# Patient Record
Sex: Female | Born: 1950 | Race: White | Hispanic: No | Marital: Married | Smoking: Never smoker
Health system: Southern US, Community
[De-identification: ages and names within clinical notes are randomized; demographics above are authoritative.]

## PROBLEM LIST (undated history)

## (undated) HISTORY — PX: BREAST SURGERY: SHX581

## (undated) HISTORY — PX: ABDOMINAL HYSTERECTOMY: SHX81

## (undated) HISTORY — PX: CERVICAL FUSION: SHX112

## (undated) HISTORY — PX: APPENDECTOMY: SHX54

## (undated) HISTORY — PX: HERNIA REPAIR: SHX51

## (undated) HISTORY — PX: CYST REMOVAL HAND: SHX6279

## (undated) HISTORY — PX: TONSILLECTOMY AND ADENOIDECTOMY: SUR1326

---

## 2004-12-31 ENCOUNTER — Emergency Department (HOSPITAL_COMMUNITY): Admission: EM | Admit: 2004-12-31 | Discharge: 2004-12-31 | Payer: Self-pay | Admitting: Emergency Medicine

## 2006-07-21 IMAGING — CR DG SHOULDER 2+V*L*
3 series · 3 of 3 positions shown · non-contrast
Comparison: none

CLINICAL DATA: Fell.  Diffuse pain.   Left rib pain. 
LEFT SHOUDER ? 3 VIEW:
There is no evidence of fracture or dislocation.  There is no evidence of arthropathy or other focal bone abnormality.  Soft tissues are unremarkable.

[w shoulder ap internal left]
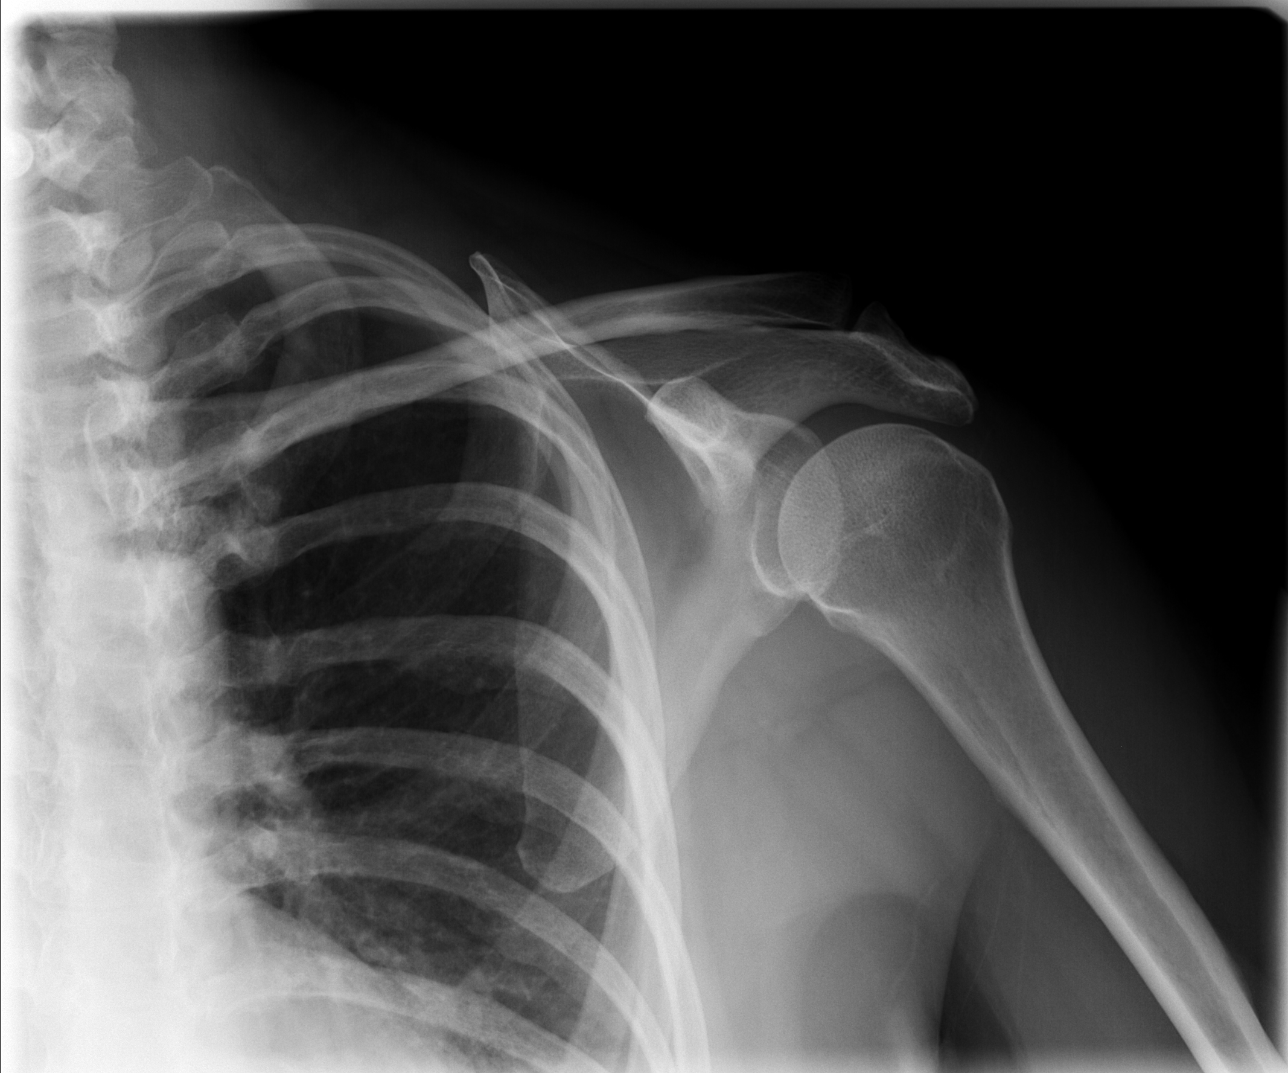

[w shoulder ap external left]
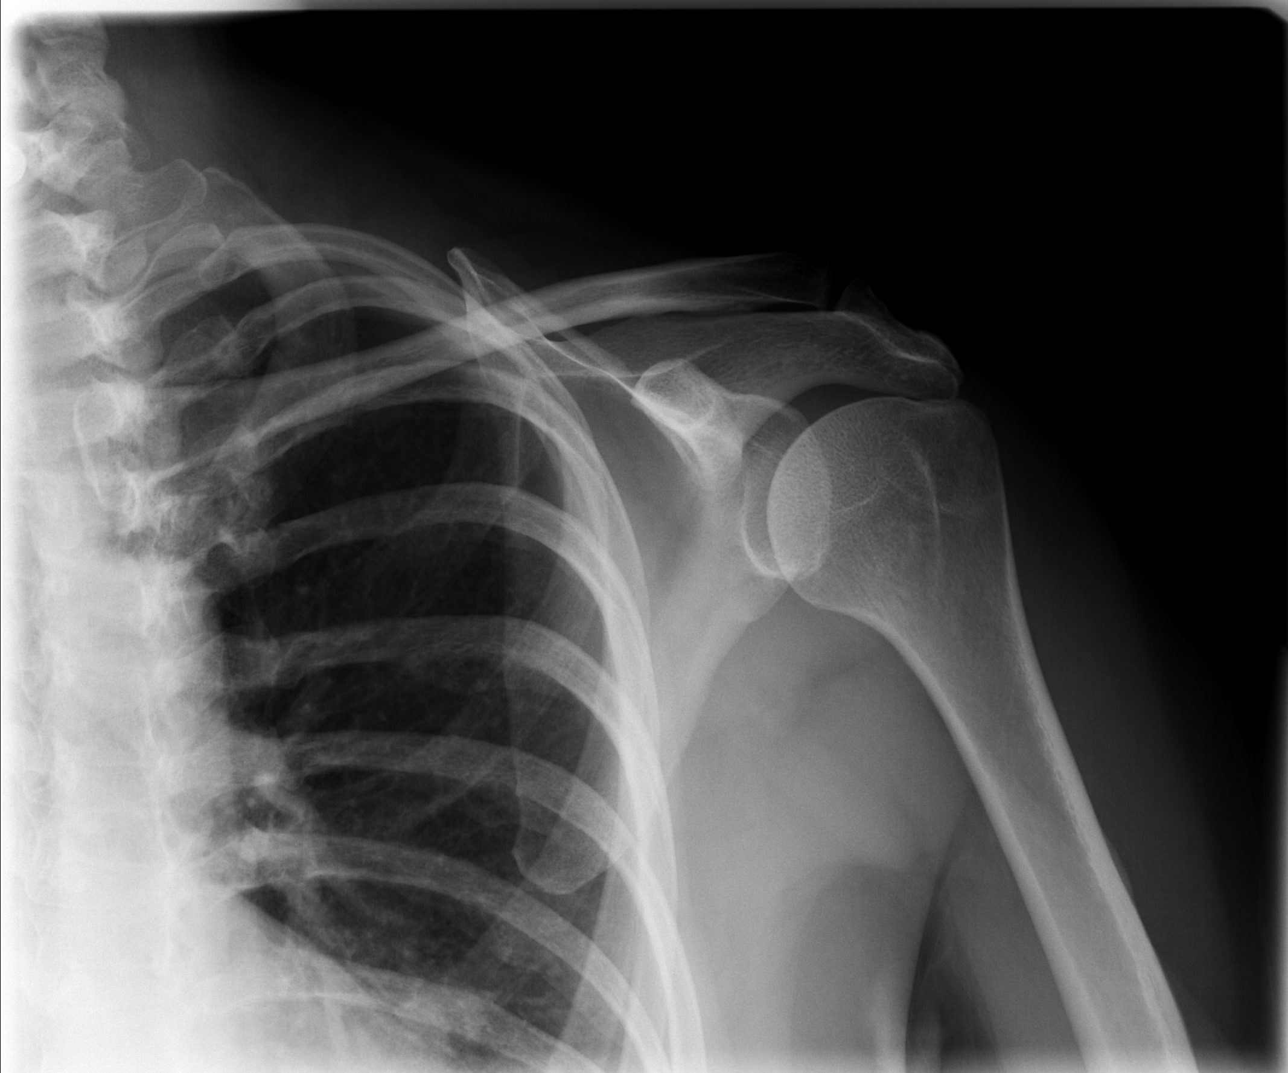

[w shoulder y view left]
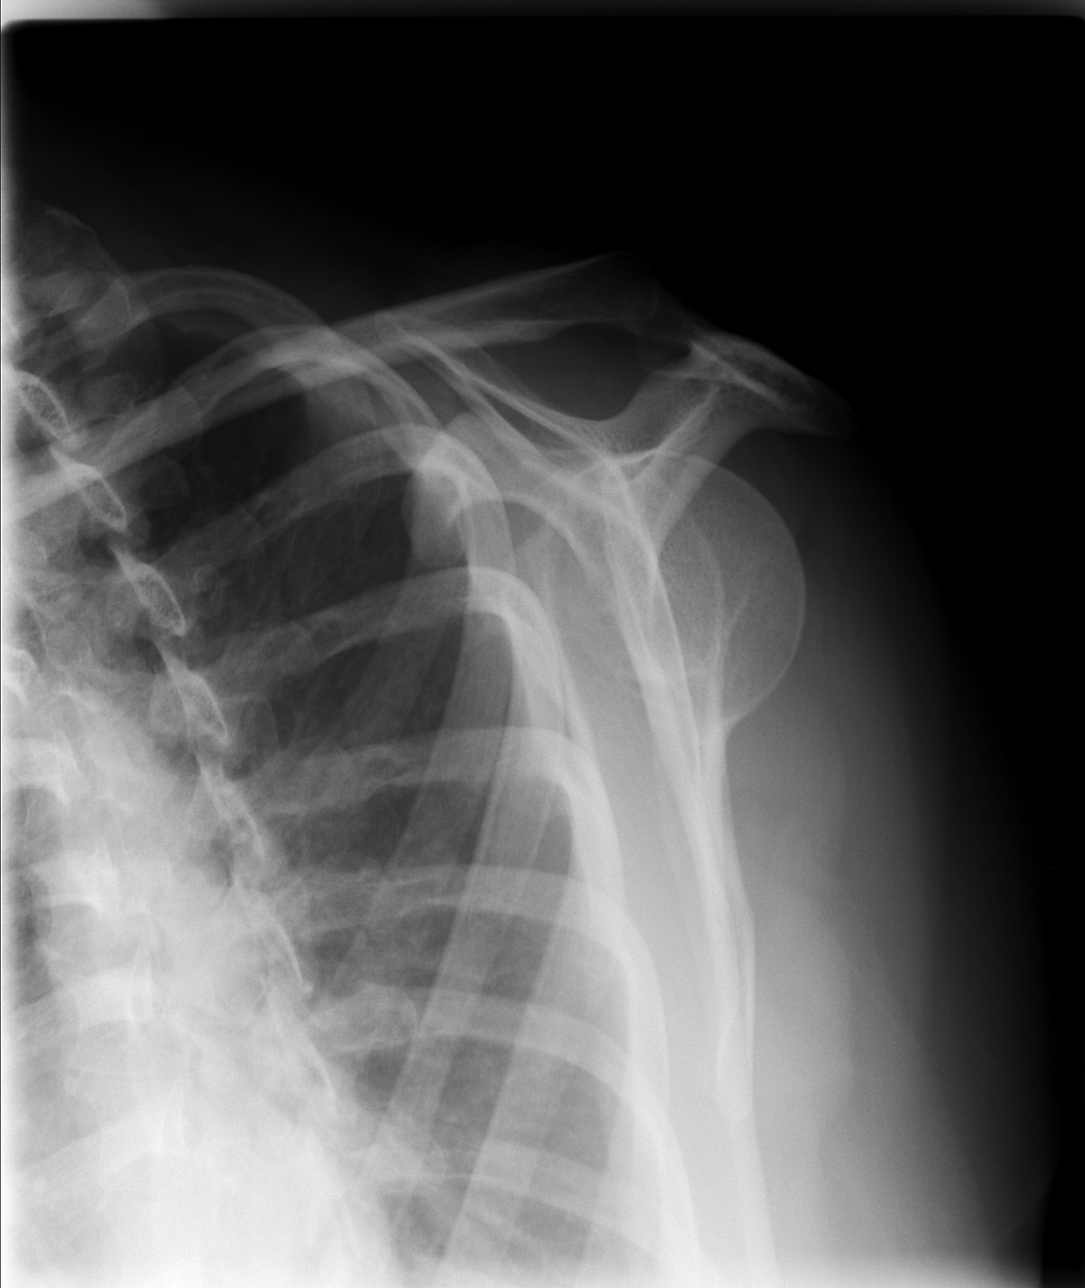

[3 of 3 positions shown; findings below may reference images not displayed]

IMPRESSION: Negative.
LEFT RIBS ? two VIEW:
No fracture or other bone lesions are seen involving the ribs.
IMPRESSION: Negative.

## 2006-07-21 IMAGING — CR DG RIBS 2V*L*
2 series · 2 of 2 positions shown · non-contrast
Comparison: none

CLINICAL DATA: Fell.  Diffuse pain.   Left rib pain. 
LEFT SHOUDER ? 3 VIEW:
There is no evidence of fracture or dislocation.  There is no evidence of arthropathy or other focal bone abnormality.  Soft tissues are unremarkable.

[w ribs ap/pa lower left]
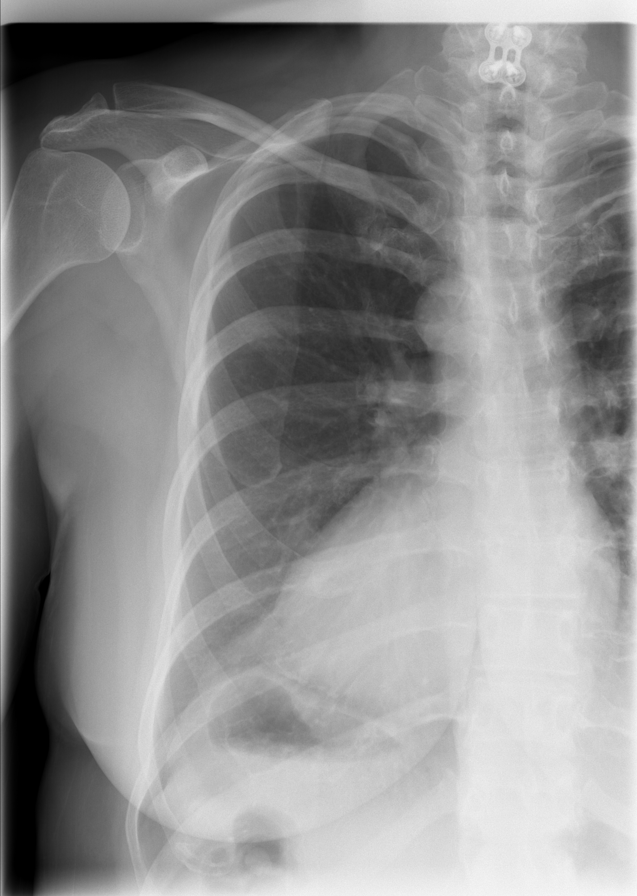

[w ribs oblique left]
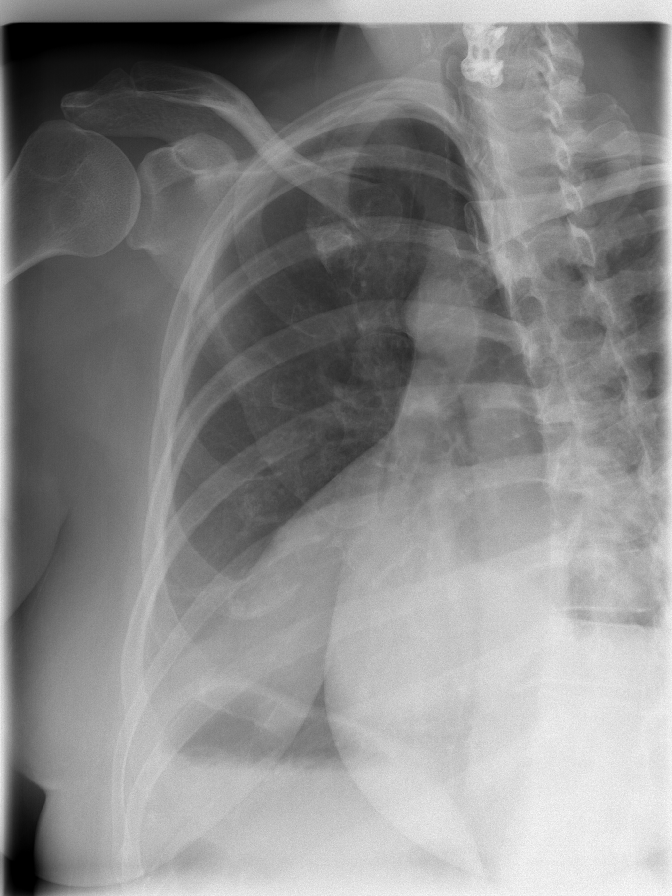

[2 of 2 positions shown; findings below may reference images not displayed]

IMPRESSION: Negative.
LEFT RIBS ? two VIEW:
No fracture or other bone lesions are seen involving the ribs.
IMPRESSION: Negative.

## 2011-10-02 DIAGNOSIS — G43909 Migraine, unspecified, not intractable, without status migrainosus: Secondary | ICD-10-CM | POA: Insufficient documentation

## 2011-10-16 DIAGNOSIS — G4733 Obstructive sleep apnea (adult) (pediatric): Secondary | ICD-10-CM | POA: Insufficient documentation

## 2011-10-16 DIAGNOSIS — K219 Gastro-esophageal reflux disease without esophagitis: Secondary | ICD-10-CM | POA: Insufficient documentation

## 2020-05-21 NOTE — Progress Notes (Signed)
[facility] [address] [place], [place] [address]  Pulmonary Sleep Medicine   Office Visit Note  Patient Name: Belinda Mccarthy DOB: January 09, 1951 MRN [id number]  I connected with  Caroline Longie on 05/23/20 by a video enabled telemedicine application and verified that I am speaking with the correct person using two identifiers.   I discussed the limitations of evaluation and management by telemedicine. The patient expressed understanding and agreed to proceed.   Chief Complaint: Obstructive Sleep Apnea visit  Brief History:  Darchelle is seen today for OSA The patient has a long history of sleep apnea. Patient is using PAP nightly.  The patient feels better after sleeping with PAP.  The patient reports benefit from PAP use. Reported sleepiness is  improved and the Epworth Sleepiness Score is 8 out of 24. The patient does take naps. The patient complains of the following: Congestion and sore throat.  The compliance download shows  compliance with an average use time of 9 hours. The AHI is 1.6  The patient does have some upper extremity limb movements disrupting sleep that is residual from a neck problem.  ROS  General: (-) fever, (-) chills, (-) night sweat Nose and Sinuses: (-) nasal stuffiness or itchiness, (-) postnasal drip, (-) nosebleeds, (+) sinus trouble. Mouth and Throat: (-) sore throat, (-) hoarseness. Neck: (-) swollen glands, (-) enlarged thyroid, (-) neck pain. Respiratory: + cough, - shortness of breath, - wheezing. Neurologic: + numbness, + tingling. Psychiatric: - anxiety, - depression   Current Medication: Outpatient Encounter Medications as of 05/22/2020  Medication Sig  . pantoprazole (PROTONIX) 40 MG tablet pantoprazole 40 mg tablet,delayed release  . rizatriptan (MAXALT) 10 MG tablet rizatriptan 10 mg tablet  . Loratadine 10 MG CAPS Take by mouth.  . polyethylene glycol powder (GLYCOLAX/MIRALAX) 17 GM/SCOOP powder Take by mouth.   No  facility-administered encounter medications on file as of 05/22/2020.    Surgical History: Past Surgical History:  Procedure Laterality Date  . ABDOMINAL HYSTERECTOMY    . APPENDECTOMY    . BREAST SURGERY    . CERVICAL FUSION    . CYST REMOVAL HAND    . HERNIA REPAIR    . TONSILLECTOMY AND ADENOIDECTOMY      Medical History: History reviewed. No pertinent past medical history.  Family History: Non contributory to the present illness  Social History: Social History   Socioeconomic History  . Marital status: Married    Spouse name: Not on file  . Number of children: Not on file  . Years of education: Not on file  . Highest education level: Not on file  Occupational History  . Not on file  Tobacco Use  . Smoking status: Never Smoker  . Smokeless tobacco: Never Used  Substance and Sexual Activity  . Alcohol use: Never  . Drug use: Never  . Sexual activity: Not on file  Other Topics Concern  . Not on file  Social History Narrative  . Not on file   Social Determinants of Health   Financial Resource Strain: Not on file  Food Insecurity: Not on file  Transportation Needs: Not on file  Physical Activity: Not on file  Stress: Not on file  Social Connections: Not on file  Intimate Partner Violence: Not on file    Vital Signs: There were no vitals taken for this visit.  Examination: General Appearance: The patient is well-developed, well-nourished, and in no distress. Neck Circumference:  Skin: Gross inspection of skin unremarkable. Head: normocephalic, no gross  deformities. Eyes: no gross deformities noted. ENT: ears appear grossly normal Neurologic: Alert and oriented. No involuntary movements.    EPWORTH SLEEPINESS SCALE:  Scale:  (0)= no chance of dozing; (1)= slight chance of dozing; (2)= moderate chance of dozing; (3)= high chance of dozing  Chance  Situtation    Sitting and reading: 1    Watching TV: 1    Sitting Inactive in public: 1    As a  passenger in car: 1      Lying down to rest: 3    Sitting and talking: 0    Sitting quielty after lunch: 1    In a car, stopped in traffic: 0   TOTAL SCORE:   8 out of 24    SLEEP STUDIES:  1. PSG 04/04/16 AHI 7 Spo61min 85%   CPAP COMPLIANCE DATA:  Date Range: 05/22/19-05/20/20  Average Daily Use: 8.7 hours  Median Use: 8.7  Compliance for > 4 Hours: 100%   AHI: 1.6 respiratory events per hour  Days Used: 365/365  Mask Leak: 12.3  95th Percentile Pressure: APAP 8-14          LABS: No results found for this or any previous visit (from the past 2160 hour(s)).  Radiology: DG Nasal Bones  Result Date: 01/01/2005 Clinical Data: Larey Seat. Abrasion to nose. Headache. Dizziness. Bilateral jaw pain.  ORTHOPANTOGRAM (PANOREX): Findings: Multiple dental amalgams are present. Upper right first molar is missing. There is no fracture or jaw dislocation.  IMPRESSION: No acute mandibular or maxillary findings.  NASAL BONES - 3 VIEW: Findings: The nasal bones on the right and left show no fracture. There may be very mild soft tissue swelling. No apparent sinus opacity is seen.  IMPRESSION: Negative for nasal bone fracture. Provider: Emelda Brothers  DG Orthopantogram  Result Date: 01/01/2005 Clinical Data: Larey Seat. Abrasion to nose. Headache. Dizziness. Bilateral jaw pain.  ORTHOPANTOGRAM (PANOREX): Findings: Multiple dental amalgams are present. Upper right first molar is missing. There is no fracture or jaw dislocation.  IMPRESSION: No acute mandibular or maxillary findings.  NASAL BONES - 3 VIEW: Findings: The nasal bones on the right and left show no fracture. There may be very mild soft tissue swelling. No apparent sinus opacity is seen.  IMPRESSION: Negative for nasal bone fracture. Provider: Emelda Brothers  DG Ribs Unilateral Left  Result Date: 01/01/2005 Clinical Data:  Larey Seat.  Diffuse pain.  Left rib pain. LEFT SHOUDER - 3 VIEW: There is no evidence of fracture or  dislocation.  There is no evidence of arthropathy or other focal bone abnormality.  Soft tissues are unremarkable. IMPRESSION: Negative. LEFT RIBS - two VIEW: No fracture or other bone lesions are seen involving the ribs. IMPRESSION: Negative. Provider: Emelda Brothers  CT Head Wo Contrast  Result Date: 01/02/2005 Clinical data: Fall. Head trauma. Headache. HEAD CT WITHOUT CONTRAST: Technique: Contiguous axial images were obtained from the base of the skull through the vertex according to standard protocol without contrast. There is no evidence of intracranial hemorrhage, brain edema, or mass effect. No other intra-axial abnormalities are seen, and the ventricles are within normal limits. No abnormal extra-axial fluid collections or masses are identified. No skull abnormalities are noted. IMPRESSION: Negative non-contrast head CT. Provider: Dicie Beam, Niel Hummer  DG Shoulder Left  Result Date: 01/01/2005 Clinical Data:  Larey Seat.  Diffuse pain.  Left rib pain. LEFT SHOUDER - 3 VIEW: There is no evidence of fracture or dislocation.  There is no evidence of arthropathy or other focal bone  abnormality.  Soft tissues are unremarkable. IMPRESSION: Negative. LEFT RIBS - two VIEW: No fracture or other bone lesions are seen involving the ribs. IMPRESSION: Negative. Provider: Emelda Brothers   No results found.  No results found.    Assessment and Plan: Patient Active Problem List   Diagnosis Date Noted  . GERD (gastroesophageal reflux disease) 10/16/2011  . OSA on CPAP 10/16/2011  . Migraines 10/02/2011      The patient can tolerate PAP and reports benefit from PAP use. The patient was reminded how to adjust mask fit.  The compliance is excellent. The AHI is 1.6.  1. OSA - Continue excellent compliance. 2. CPAP couseling-Discussed importance of adequate CPAP use as well as proper care and cleaning techniques of machine and all supplies. 3. GERD - Continue pantoprazole as needed.  General  Counseling: I have discussed the findings of the evaluation and examination with Tyrene.  I have also discussed any further diagnostic evaluation thatmay be needed or ordered today. Pauletta verbalizes understanding of the findings of todays visit. We also reviewed her medications today and discussed drug interactions and side effects including but not limited excessive drowsiness and altered mental states. We also discussed that there is always a risk not just to her but also people around her. she has been encouraged to call the office with any questions or concerns that should arise related to todays visit.  No orders of the defined types were placed in this encounter.     I have personally obtained a history, examined the patient, evaluated laboratory and imaging results, formulated the assessment and plan and placed orders.  This patient was seen by Lynn Ito, PA-C in collaboration with Dr. Freda Munro as a part of collaborative care agreement.   Valentino Hue Sol Blazing, PhD, FAASM  Diplomate, American Board of Sleep Medicine    Yevonne Pax, MD [facility] Diplomate ABMS Pulmonary and Critical Care Medicine Sleep medicine

## 2020-05-22 ENCOUNTER — Ambulatory Visit (INDEPENDENT_AMBULATORY_CARE_PROVIDER_SITE_OTHER): Payer: Medicare Other | Admitting: Internal Medicine

## 2020-05-22 DIAGNOSIS — Z7189 Other specified counseling: Secondary | ICD-10-CM | POA: Diagnosis not present

## 2020-05-22 DIAGNOSIS — Z9989 Dependence on other enabling machines and devices: Secondary | ICD-10-CM | POA: Diagnosis not present

## 2020-05-22 DIAGNOSIS — K219 Gastro-esophageal reflux disease without esophagitis: Secondary | ICD-10-CM

## 2020-05-22 DIAGNOSIS — G4733 Obstructive sleep apnea (adult) (pediatric): Secondary | ICD-10-CM | POA: Diagnosis not present

## 2020-05-22 NOTE — Patient Instructions (Signed)

## 2021-06-11 NOTE — Progress Notes (Signed)
[facility] [address] [place], [place] [address]  Pulmonary Sleep Medicine   Office Visit Note  Patient Name: Belinda Mccarthy DOB: [birth year] MRN [id number]    Chief Complaint: Obstructive Sleep Apnea visit  Brief History:  Belinda Mccarthy is seen today for annual follow up visit for APAP@ 8-14 cmH2O. The patient has a longstanding history of sleep apnea. Patient is using PAP nightly.  The patient feels rested after sleeping with PAP.  The patient reports benefiting from PAP use. Reported sleepiness is improved and the Epworth Sleepiness Score is 13 out of 24. The patient sometimes take naps. The patient complains of the following: None.  The compliance download shows 100% compliance with an average use time of 8 hours 52 minutes. The AHI is 1.9.  The patient does not complain of limb movements disrupting sleep.  ROS  General: (-) fever, (-) chills, (-) night sweat Nose and Sinuses: (-) nasal stuffiness or itchiness, (-) postnasal drip, (-) nosebleeds, (-) sinus trouble. Mouth and Throat: (-) sore throat, (-) hoarseness. Neck: (-) swollen glands, (-) enlarged thyroid, (-) neck pain. Respiratory: - cough, - shortness of breath, - wheezing. Neurologic: + numbness, + tingling. Psychiatric: - anxiety, - depression   Current Medication: Outpatient Encounter Medications as of 06/12/2021  Medication Sig   pantoprazole (PROTONIX) 40 MG tablet pantoprazole 40 mg tablet,delayed release   polyethylene glycol powder (GLYCOLAX/MIRALAX) 17 GM/SCOOP powder Take by mouth.   rizatriptan (MAXALT) 10 MG tablet rizatriptan 10 mg tablet   [DISCONTINUED] Loratadine 10 MG CAPS Take by mouth.   No facility-administered encounter medications on file as of 06/12/2021.    Surgical History: Past Surgical History:  Procedure Laterality Date   ABDOMINAL HYSTERECTOMY     APPENDECTOMY     BREAST SURGERY     CERVICAL FUSION     CYST REMOVAL HAND     HERNIA REPAIR     TONSILLECTOMY AND  ADENOIDECTOMY      Medical History: History reviewed. No pertinent past medical history.  Family History: Non contributory to the present illness  Social History: Social History   Socioeconomic History   Marital status: Married    Spouse name: Not on file   Number of children: Not on file   Years of education: Not on file   Highest education level: Not on file  Occupational History   Not on file  Tobacco Use   Smoking status: Never   Smokeless tobacco: Never  Substance and Sexual Activity   Alcohol use: Never   Drug use: Never   Sexual activity: Not on file  Other Topics Concern   Not on file  Social History Narrative   Not on file   Social Determinants of Health   Financial Resource Strain: Not on file  Food Insecurity: Not on file  Transportation Needs: Not on file  Physical Activity: Not on file  Stress: Not on file  Social Connections: Not on file  Intimate Partner Violence: Not on file    Vital Signs: Blood pressure 135/81, pulse 75, resp. rate 18, height 5\' 4"  (1.626 m), weight 245 lb (111.1 kg), SpO2 100 %. Body mass index is 42.05 kg/m.    Examination: General Appearance: The patient is well-developed, well-nourished, and in no distress. Neck Circumference: 43 cm Skin: Gross inspection of skin unremarkable. Head: normocephalic, no gross deformities. Eyes: no gross deformities noted. ENT: ears appear grossly normal Neurologic: Alert and oriented. No involuntary movements.    EPWORTH SLEEPINESS SCALE:  Scale:  (0)= no  chance of dozing; (1)= slight chance of dozing; (2)= moderate chance of dozing; (3)= high chance of dozing  Chance  Situtation    Sitting and reading: 2    Watching TV: 2    Sitting Inactive in public: 2    As a passenger in car: 2      Lying down to rest: 3    Sitting and talking: 0    Sitting quielty after lunch: 2    In a car, stopped in traffic: 0   TOTAL SCORE:   13 out of 24    SLEEP STUDIES:  PSG  (03/2016) AHI 7/hr, min SpO2 85% Titration (04/2016) APAP@ 8-14 cmH2O   CPAP COMPLIANCE DATA:  Date Range: 06/04/2020-06/03/2021  Average Daily Use: 8 hours 52 minutes  Median Use: 8 hours 49 minutes  Compliance for > 4 Hours: 100%  AHI: 1.9 respiratory events per hour  Days Used: 365/365 days  Mask Leak: 31.1  95th Percentile Pressure: 12.8         LABS: No results found for this or any previous visit (from the past 2160 hour(s)).  Radiology: DG Nasal Bones  Result Date: 01/01/2005 Clinical Data: Larey Seat. Abrasion to nose. Headache. Dizziness. Bilateral jaw pain.  ORTHOPANTOGRAM (PANOREX): Findings: Multiple dental amalgams are present. Upper right first molar is missing. There is no fracture or jaw dislocation.  IMPRESSION: No acute mandibular or maxillary findings.  NASAL BONES - 3 VIEW: Findings: The nasal bones on the right and left show no fracture. There may be very mild soft tissue swelling. No apparent sinus opacity is seen.  IMPRESSION: Negative for nasal bone fracture. Provider: Emelda Brothers  DG Orthopantogram  Result Date: 01/01/2005 Clinical Data: Larey Seat. Abrasion to nose. Headache. Dizziness. Bilateral jaw pain.  ORTHOPANTOGRAM (PANOREX): Findings: Multiple dental amalgams are present. Upper right first molar is missing. There is no fracture or jaw dislocation.  IMPRESSION: No acute mandibular or maxillary findings.  NASAL BONES - 3 VIEW: Findings: The nasal bones on the right and left show no fracture. There may be very mild soft tissue swelling. No apparent sinus opacity is seen.  IMPRESSION: Negative for nasal bone fracture. Provider: Emelda Brothers  DG Ribs Unilateral Left  Result Date: 01/01/2005 Clinical Data:  Larey Seat.  Diffuse pain.  Left rib pain. LEFT SHOUDER - 3 VIEW: There is no evidence of fracture or dislocation.  There is no evidence of arthropathy or other focal bone abnormality.  Soft tissues are unremarkable. IMPRESSION: Negative. LEFT RIBS - two  VIEW: No fracture or other bone lesions are seen involving the ribs. IMPRESSION: Negative. Provider: Emelda Brothers  CT Head Wo Contrast  Result Date: 01/02/2005 Clinical data: Fall. Head trauma. Headache. HEAD CT WITHOUT CONTRAST: Technique: Contiguous axial images were obtained from the base of the skull through the vertex according to standard protocol without contrast. There is no evidence of intracranial hemorrhage, brain edema, or mass effect. No other intra-axial abnormalities are seen, and the ventricles are within normal limits. No abnormal extra-axial fluid collections or masses are identified. No skull abnormalities are noted. IMPRESSION: Negative non-contrast head CT. Provider: Dicie Beam, Niel Hummer  DG Shoulder Left  Result Date: 01/01/2005 Clinical Data:  Larey Seat.  Diffuse pain.  Left rib pain. LEFT SHOUDER - 3 VIEW: There is no evidence of fracture or dislocation.  There is no evidence of arthropathy or other focal bone abnormality.  Soft tissues are unremarkable. IMPRESSION: Negative. LEFT RIBS - two VIEW: No fracture or other bone lesions are seen  involving the ribs. IMPRESSION: Negative. Provider: Emelda Brothers   No results found.  No results found.    Assessment and Plan: Patient Active Problem List   Diagnosis Date Noted   GERD (gastroesophageal reflux disease) 10/16/2011   OSA on CPAP 10/16/2011   Migraines 10/02/2011      The patient does tolerate PAP and reports benefit from PAP use. The patient was reminded how to adjust mask fit and advised to change supplies regularly. The patient was also counselled on nightly use. The compliance is excellent. The AHI is 1.9.   1. OSA on CPAP Continue excellent compliance  2. CPAP use counseling CPAP couseling-Discussed importance of adequate CPAP use as well as proper care and cleaning techniques of machine and all supplies.  3. Gastroesophageal reflux disease without esophagitis Continue PPI  4. Morbid obesity  with BMI of 40.0-44.9, adult (HCC) Obesity Counseling: Had a lengthy discussion regarding patients BMI and weight issues. Patient was instructed on portion control as well as increased activity. Also discussed caloric restrictions with trying to maintain intake less than 2000 Kcal. Discussions were made in accordance with the 5As of weight management. Simple actions such as not eating late and if able to, taking a walk is suggested.    General Counseling: I have discussed the findings of the evaluation and examination with Belinda Mccarthy.  I have also discussed any further diagnostic evaluation thatmay be needed or ordered today. Belinda Mccarthy verbalizes understanding of the findings of todays visit. We also reviewed her medications today and discussed drug interactions and side effects including but not limited excessive drowsiness and altered mental states. We also discussed that there is always a risk not just to her but also people around her. she has been encouraged to call the office with any questions or concerns that should arise related to todays visit.  No orders of the defined types were placed in this encounter.       I have personally obtained a history, examined the patient, evaluated laboratory and imaging results, formulated the assessment and plan and placed orders.  This patient was seen by Lynn Ito, PA-C in collaboration with Dr. Freda Munro as a part of collaborative care agreement.  Yevonne Pax, MD [facility] Diplomate ABMS Pulmonary Critical Care Medicine and Sleep Medicine

## 2021-06-12 ENCOUNTER — Ambulatory Visit (INDEPENDENT_AMBULATORY_CARE_PROVIDER_SITE_OTHER): Payer: Medicare Other | Admitting: Internal Medicine

## 2021-06-12 VITALS — BP 135/81 | HR 75 | Resp 18 | Ht 64.0 in | Wt 245.0 lb

## 2021-06-12 DIAGNOSIS — G4733 Obstructive sleep apnea (adult) (pediatric): Secondary | ICD-10-CM | POA: Diagnosis not present

## 2021-06-12 DIAGNOSIS — K219 Gastro-esophageal reflux disease without esophagitis: Secondary | ICD-10-CM

## 2021-06-12 DIAGNOSIS — Z7189 Other specified counseling: Secondary | ICD-10-CM | POA: Diagnosis not present

## 2021-06-12 DIAGNOSIS — Z9989 Dependence on other enabling machines and devices: Secondary | ICD-10-CM

## 2021-06-12 DIAGNOSIS — Z6841 Body Mass Index (BMI) 40.0 and over, adult: Secondary | ICD-10-CM

## 2021-06-25 NOTE — Patient Instructions (Signed)

## 2022-06-09 NOTE — Progress Notes (Signed)
[facility] [place], [place] [address]  Pulmonary Sleep Medicine   Office Visit Note  Patient Name: Belinda Mccarthy DOB: 18-Jan-1951 MRN [id number]    Chief Complaint: Obstructive Sleep Apnea visit  Brief History:  Belinda Mccarthy is seen today for an annual follow up visit for APAP@ 8-14 cmH2O. The patient has a longstanding history of sleep apnea. Patient is using PAP nightly.  The patient feels rested after sleeping with PAP.  The patient reports benefiting from PAP use. Reported sleepiness is  improved and the Epworth Sleepiness Score is 11 out of 24. The patient occasionally will take naps. The patient complains of the following: none.  The compliance download shows 99% compliance with an average use time of 8 hours 53 minutes. The AHI is 2.5.  The patient does not complain of limb movements disrupting sleep. The patient continues to require PAP therapy in order to eliminate sleep apnea.   ROS  General: (-) fever, (-) chills, (-) night sweat Nose and Sinuses: (-) nasal stuffiness or itchiness, (-) postnasal drip, (-) nosebleeds, (-) sinus trouble. Mouth and Throat: (-) sore throat, (-) hoarseness. Neck: (-) swollen glands, (-) enlarged thyroid, (-) neck pain. Respiratory: - cough, - shortness of breath, - wheezing. Neurologic: + numbness, + tingling. Psychiatric: - anxiety, - depression   Current Medication: Outpatient Encounter Medications as of 06/10/2022  Medication Sig   pantoprazole (PROTONIX) 40 MG tablet pantoprazole 40 mg tablet,delayed release   polyethylene glycol powder (GLYCOLAX/MIRALAX) 17 GM/SCOOP powder Take by mouth.   rizatriptan (MAXALT) 10 MG tablet rizatriptan 10 mg tablet   No facility-administered encounter medications on file as of 06/10/2022.    Surgical History: Past Surgical History:  Procedure Laterality Date   ABDOMINAL HYSTERECTOMY     APPENDECTOMY     BREAST SURGERY     CERVICAL FUSION     CYST REMOVAL HAND     HERNIA  REPAIR     TONSILLECTOMY AND ADENOIDECTOMY      Medical History: History reviewed. No pertinent past medical history.  Family History: Non contributory to the present illness  Social History: Social History   Socioeconomic History   Marital status: Married    Spouse name: Not on file   Number of children: Not on file   Years of education: Not on file   Highest education level: Not on file  Occupational History   Not on file  Tobacco Use   Smoking status: Never   Smokeless tobacco: Never  Substance and Sexual Activity   Alcohol use: Never   Drug use: Never   Sexual activity: Not on file  Other Topics Concern   Not on file  Social History Narrative   Not on file   Social Determinants of Health   Financial Resource Strain: Not on file  Food Insecurity: Not on file  Transportation Needs: Not on file  Physical Activity: Not on file  Stress: Not on file  Social Connections: Not on file  Intimate Partner Violence: Not on file    Vital Signs: Blood pressure 139/89, pulse 95, resp. rate 18, height '5\' 4"'$  (1.626 m), weight 245 lb (111.1 kg), SpO2 97 %. Body mass index is 42.05 kg/m.    Examination: General Appearance: The patient is well-developed, well-nourished, and in no distress. Neck Circumference: 43 cm Skin: Gross inspection of skin unremarkable. Head: normocephalic, no gross deformities. Eyes: no gross deformities noted. ENT: ears appear grossly normal Neurologic: Alert and oriented. No involuntary movements.  STOP BANG RISK  ASSESSMENT S (snore) Have you been told that you snore?     NO   T (tired) Are you often tired, fatigued, or sleepy during the day?   YES  O (obstruction) Do you stop breathing, choke, or gasp during sleep? NO   P (pressure) Do you have or are you being treated for high blood pressure? NO   B (BMI) Is your body index greater than 35 kg/m? YES   A (age) Are you 72 years old or older? YES   N (neck) Do you have a neck  circumference greater than 16 inches?   YES   G (gender) Are you a female? NO   TOTAL STOP/BANG "YES" ANSWERS 4       A STOP-Bang score of 2 or less is considered low risk, and a score of 5 or more is high risk for having either moderate or severe OSA. For people who score 3 or 4, doctors may need to perform further assessment to determine how likely they are to have OSA.         EPWORTH SLEEPINESS SCALE:  Scale:  (0)= no chance of dozing; (1)= slight chance of dozing; (2)= moderate chance of dozing; (3)= high chance of dozing  Chance  Situtation    Sitting and reading: 2    Watching TV: 1    Sitting Inactive in public: 2    As a passenger in car: 3      Lying down to rest: 2    Sitting and talking: 0    Sitting quielty after lunch: 1    In a car, stopped in traffic: 0   TOTAL SCORE:   11 out of 24    SLEEP STUDIES:  PSG (03/2016) AHI 7/hr, min SpO2 85% Titration (04/2016) APAP@ 8-14 cmH2O   CPAP COMPLIANCE DATA:  Date Range: 06/11/2021-06/10/2022   Average Daily Use: 8 hours 53 minutes  Median Use: 8 hours 46 minutes  Compliance for > 4 Hours: 99%  AHI: 2.5 respiratory events per hour  Days Used: 364/365 days  Mask Leak: 27.6  95th Percentile Pressure: 13         LABS: No results found for this or any previous visit (from the past 2160 hour(s)).  Radiology: CT Head Wo Contrast  Result Date: 01/02/2005 Clinical data: Fall. Head trauma. Headache. HEAD CT WITHOUT CONTRAST: Technique: Contiguous axial images were obtained from the base of the skull through the vertex according to standard protocol without contrast. There is no evidence of intracranial hemorrhage, brain edema, or mass effect. No other intra-axial abnormalities are seen, and the ventricles are within normal limits. No abnormal extra-axial fluid collections or masses are identified. No skull abnormalities are noted. IMPRESSION: Negative non-contrast head CT. Provider: Alfonse Flavors,  Lennice Sites  DG Shoulder Left  Result Date: 01/01/2005 Clinical Data:  [place].  Diffuse pain.  Left rib pain. LEFT SHOUDER - 3 VIEW: There is no evidence of fracture or dislocation.  There is no evidence of arthropathy or other focal bone abnormality.  Soft tissues are unremarkable. IMPRESSION: Negative. LEFT RIBS - two VIEW: No fracture or other bone lesions are seen involving the ribs. IMPRESSION: Negative. Provider: Jaci Lazier  DG Ribs Unilateral Left  Result Date: 01/01/2005 Clinical Data:  [place].  Diffuse pain.  Left rib pain. LEFT SHOUDER - 3 VIEW: There is no evidence of fracture or dislocation.  There is no evidence of arthropathy or other focal bone abnormality.  Soft tissues are unremarkable. IMPRESSION: Negative.  LEFT RIBS - two VIEW: No fracture or other bone lesions are seen involving the ribs. IMPRESSION: Negative. Provider: Jaci Lazier  DG Nasal Bones  Result Date: 01/01/2005 Clinical Data: [place]. Abrasion to nose. Headache. Dizziness. Bilateral jaw pain.  ORTHOPANTOGRAM (PANOREX): Findings: Multiple dental amalgams are present. Upper right first molar is missing. There is no fracture or jaw dislocation.  IMPRESSION: No acute mandibular or maxillary findings.  NASAL BONES - 3 VIEW: Findings: The nasal bones on the right and left show no fracture. There may be very mild soft tissue swelling. No apparent sinus opacity is seen.  IMPRESSION: Negative for nasal bone fracture. Provider: Jaci Lazier  DG Orthopantogram  Result Date: 01/01/2005 Clinical Data: [place]. Abrasion to nose. Headache. Dizziness. Bilateral jaw pain.  ORTHOPANTOGRAM (PANOREX): Findings: Multiple dental amalgams are present. Upper right first molar is missing. There is no fracture or jaw dislocation.  IMPRESSION: No acute mandibular or maxillary findings.  NASAL BONES - 3 VIEW: Findings: The nasal bones on the right and left show no fracture. There may be very mild soft tissue swelling. No apparent sinus opacity is  seen.  IMPRESSION: Negative for nasal bone fracture. Provider: Jaci Lazier   No results found.  No results found.    Assessment and Plan: Patient Active Problem List   Diagnosis Date Noted   GERD (gastroesophageal reflux disease) 10/16/2011   OSA on CPAP 10/16/2011   Migraines 10/02/2011      The patient does tolerate PAP and reports benefit from PAP use. The patient was reminded how to adjust mask fit and advised to change supplies regularly. The patient was also counselled on nightly use. The compliance is excellent. The AHI is 2.4. Pt continues to require cpap to treat her apnea and is medically necessary.   1. OSA on CPAP Continue excellent compliance  2. CPAP use counseling CPAP couseling-Discussed importance of adequate CPAP use as well as proper care and cleaning techniques of machine and all supplies.  3. Gastroesophageal reflux disease without esophagitis Continue PPI  4. Morbid obesity with BMI of 40.0-44.9, adult ([place]) Obesity Counseling: Had a lengthy discussion regarding patients BMI and weight issues. Patient was instructed on portion control as well as increased activity. Also discussed caloric restrictions with trying to maintain intake less than 2000 Kcal. Discussions were made in accordance with the 5As of weight management. Simple actions such as not eating late and if able to, taking a walk is suggested.    General Counseling: I have discussed the findings of the evaluation and examination with Belinda Mccarthy.  I have also discussed any further diagnostic evaluation thatmay be needed or ordered today. Belinda Mccarthy verbalizes understanding of the findings of todays visit. We also reviewed her medications today and discussed drug interactions and side effects including but not limited excessive drowsiness and altered mental states. We also discussed that there is always a risk not just to her but also people around her. she has been encouraged to call the office with any  questions or concerns that should arise related to todays visit.  No orders of the defined types were placed in this encounter.       I have personally obtained a history, examined the patient, evaluated laboratory and imaging results, formulated the assessment and plan and placed orders.  This patient was seen by Drema Dallas, PA-C in collaboration with Dr. Devona Konig as a part of collaborative care agreement.  Allyne Gee, MD [facility] Diplomate ABMS Pulmonary Critical Care Medicine and Sleep Medicine

## 2022-06-10 ENCOUNTER — Ambulatory Visit (INDEPENDENT_AMBULATORY_CARE_PROVIDER_SITE_OTHER): Payer: Medicare Other | Admitting: Internal Medicine

## 2022-06-10 VITALS — BP 139/89 | HR 95 | Resp 18 | Ht 64.0 in | Wt 245.0 lb

## 2022-06-10 DIAGNOSIS — K219 Gastro-esophageal reflux disease without esophagitis: Secondary | ICD-10-CM | POA: Diagnosis not present

## 2022-06-10 DIAGNOSIS — Z6841 Body Mass Index (BMI) 40.0 and over, adult: Secondary | ICD-10-CM

## 2022-06-10 DIAGNOSIS — G4733 Obstructive sleep apnea (adult) (pediatric): Secondary | ICD-10-CM

## 2022-06-10 DIAGNOSIS — Z7189 Other specified counseling: Secondary | ICD-10-CM | POA: Diagnosis not present

## 2022-06-10 NOTE — Patient Instructions (Signed)

## 2023-03-17 ENCOUNTER — Ambulatory Visit (INDEPENDENT_AMBULATORY_CARE_PROVIDER_SITE_OTHER): Payer: Medicare Other | Admitting: Internal Medicine

## 2023-03-17 VITALS — BP 142/85 | HR 76 | Resp 18 | Ht 64.0 in | Wt 233.0 lb

## 2023-03-17 DIAGNOSIS — Z7189 Other specified counseling: Secondary | ICD-10-CM

## 2023-03-17 DIAGNOSIS — Z6841 Body Mass Index (BMI) 40.0 and over, adult: Secondary | ICD-10-CM

## 2023-03-17 DIAGNOSIS — K219 Gastro-esophageal reflux disease without esophagitis: Secondary | ICD-10-CM

## 2023-03-17 DIAGNOSIS — G4733 Obstructive sleep apnea (adult) (pediatric): Secondary | ICD-10-CM

## 2023-03-17 NOTE — Patient Instructions (Signed)

## 2023-03-17 NOTE — Progress Notes (Signed)
[facility] [address] [place], [place] [address]  Pulmonary Sleep Medicine   Office Visit Note  Patient Name: Belinda Mccarthy DOB: [birth year] MRN [id number]    Chief Complaint: Obstructive Sleep Apnea visit  Brief History:  Belinda Mccarthy is seen today for a follow up visit for APAP@ 8-14 cmH2O. The patient has a longstanding history of sleep apnea. Patient is using PAP nightly.  The patient feels rested after sleeping with PAP.  The patient reports benefiting from PAP use. Reported sleepiness is  improved and the Epworth Sleepiness Score is 10 out of 24. The patient will occasionally take naps. Prior to using a PAP, patient reports the following symptoms: snoring, fatigue, trouble concentrating, brain fogginess, choking, gasping and apneic pauses. The patient complains of the following: Patient is in need of a replacement unit. The compliance download shows 99% compliance with an average use time of 8 hours 56 minutes. The AHI is 2.7.  The patient does not complain of limb movements disrupting sleep. The patient continues to require PAP therapy in order to eliminate sleep apnea.   ROS  General: (-) fever, (-) chills, (-) night sweat Nose and Sinuses: (-) nasal stuffiness or itchiness, (-) postnasal drip, (-) nosebleeds, (-) sinus trouble. Mouth and Throat: (-) sore throat, (-) hoarseness. Neck: (-) swollen glands, (-) enlarged thyroid, (-) neck pain. Respiratory: - cough, - shortness of breath, - wheezing. Neurologic: + numbness, + tingling. Psychiatric: - anxiety, - depression   Current Medication: Outpatient Encounter Medications as of 03/17/2023  Medication Sig   pantoprazole (PROTONIX) 40 MG tablet pantoprazole 40 mg tablet,delayed release   polyethylene glycol powder (GLYCOLAX/MIRALAX) 17 GM/SCOOP powder Take by mouth.   rizatriptan (MAXALT) 10 MG tablet rizatriptan 10 mg tablet   No facility-administered encounter medications on file as of 03/17/2023.    Surgical  History: Past Surgical History:  Procedure Laterality Date   ABDOMINAL HYSTERECTOMY     APPENDECTOMY     BREAST SURGERY     CERVICAL FUSION     CYST REMOVAL HAND     HERNIA REPAIR     TONSILLECTOMY AND ADENOIDECTOMY      Medical History: History reviewed. No pertinent past medical history.  Family History: Non contributory to the present illness  Social History: Social History   Socioeconomic History   Marital status: Married    Spouse name: Not on file   Number of children: Not on file   Years of education: Not on file   Highest education level: Not on file  Occupational History   Not on file  Tobacco Use   Smoking status: Never   Smokeless tobacco: Never  Substance and Sexual Activity   Alcohol use: Never   Drug use: Never   Sexual activity: Not on file  Other Topics Concern   Not on file  Social History Narrative   Not on file   Social Determinants of Health   Financial Resource Strain: Low Risk  (08/12/2022)   Received from [facility]   Overall Financial Resource Strain (CARDIA)    Difficulty of Paying Living Expenses: Not hard at all  Food Insecurity: No Food Insecurity (08/12/2022)   Received from [facility]   Hunger Vital Sign    Worried About Running Out of Food in the Last Year: Never true    Ran Out of Food in the Last Year: Never true  Transportation Needs: No Transportation Needs (08/12/2022)   Received from [facility]  PRAPARE - Transportation    In the past 12 months, has lack of transportation kept you from medical appointments or from getting medications?: No    Lack of Transportation (Non-Medical): No  Physical Activity: Not on file  Stress: Not on file  Social Connections: Not on file  Intimate Partner Violence: Not on file    Vital Signs: Blood pressure (!) 142/85, pulse 76, resp. rate 18, height 5\' 4"  (1.626 m), weight 233 lb (105.7 kg), SpO2 97%. Body mass index is 39.99 kg/m.     Examination: General Appearance: The patient is well-developed, well-nourished, and in no distress. Neck Circumference: 43 cm Skin: Gross inspection of skin unremarkable. Head: normocephalic, no gross deformities. Eyes: no gross deformities noted. ENT: ears appear grossly normal Neurologic: Alert and oriented. No involuntary movements.  STOP BANG RISK ASSESSMENT S (snore) Have you been told that you snore?     NO   T (tired) Are you often tired, fatigued, or sleepy during the day?   NO  O (obstruction) Do you stop breathing, choke, or gasp during sleep? NO   P (pressure) Do you have or are you being treated for high blood pressure? NO   B (BMI) Is your body index greater than 35 kg/m? YES   A (age) Are you 82 years old or older? YES   N (neck) Do you have a neck circumference greater than 16 inches?   YES   G (gender) Are you a female? NO   TOTAL STOP/BANG "YES" ANSWERS 3       A STOP-Bang score of 2 or less is considered low risk, and a score of 5 or more is high risk for having either moderate or severe OSA. For people who score 3 or 4, doctors may need to perform further assessment to determine how likely they are to have OSA.         EPWORTH SLEEPINESS SCALE:  Scale:  (0)= no chance of dozing; (1)= slight chance of dozing; (2)= moderate chance of dozing; (3)= high chance of dozing  Chance  Situtation    Sitting and reading: 2    Watching TV: 2    Sitting Inactive in public: 1    As a passenger in car: 1      Lying down to rest: 3    Sitting and talking: 0    Sitting quielty after lunch: 1    In a car, stopped in traffic: 0   TOTAL SCORE:   10 out of 24    SLEEP STUDIES:  PSG (03/2016) AHI 7/hr, min SpO2 85% Titration (04/2016) APAP@ 8-14 cmH2O   CPAP COMPLIANCE DATA:  Date Range: 03/18/2022-03/17/2023  Average Daily Use: 8 hours 56 minutes  Median Use: 8 hours 45 minutes  Compliance for > 4 Hours: 99%  AHI: 2.7 respiratory events per  hour  Days Used: 363/365 days  Mask Leak: 28.9  95th Percentile Pressure: 13.3         LABS: No results found for this or any previous visit (from the past 2160 hour(s)).  Radiology: CT Head Wo Contrast  Result Date: 01/02/2005 Clinical data: Fall. Head trauma. Headache. HEAD CT WITHOUT CONTRAST: Technique: Contiguous axial images were obtained from the base of the skull through the vertex according to standard protocol without contrast. There is no evidence of intracranial hemorrhage, brain edema, or mass effect. No other intra-axial abnormalities are seen, and the ventricles are within normal limits. No abnormal extra-axial fluid collections or masses are identified.  No skull abnormalities are noted. IMPRESSION: Negative non-contrast head CT. Provider: Dicie Beam, Niel Hummer  DG Shoulder Left  Result Date: 01/01/2005 Clinical Data:  [place].  Diffuse pain.  Left rib pain. LEFT SHOUDER - 3 VIEW: There is no evidence of fracture or dislocation.  There is no evidence of arthropathy or other focal bone abnormality.  Soft tissues are unremarkable. IMPRESSION: Negative. LEFT RIBS - two VIEW: No fracture or other bone lesions are seen involving the ribs. IMPRESSION: Negative. Provider: Emelda Brothers  DG Ribs Unilateral Left  Result Date: 01/01/2005 Clinical Data:  [place].  Diffuse pain.  Left rib pain. LEFT SHOUDER - 3 VIEW: There is no evidence of fracture or dislocation.  There is no evidence of arthropathy or other focal bone abnormality.  Soft tissues are unremarkable. IMPRESSION: Negative. LEFT RIBS - two VIEW: No fracture or other bone lesions are seen involving the ribs. IMPRESSION: Negative. Provider: Emelda Brothers  DG Nasal Bones  Result Date: 01/01/2005 Clinical Data: [place]. Abrasion to nose. Headache. Dizziness. Bilateral jaw pain.  ORTHOPANTOGRAM (PANOREX): Findings: Multiple dental amalgams are present. Upper right first molar is missing. There is no fracture or jaw  dislocation.  IMPRESSION: No acute mandibular or maxillary findings.  NASAL BONES - 3 VIEW: Findings: The nasal bones on the right and left show no fracture. There may be very mild soft tissue swelling. No apparent sinus opacity is seen.  IMPRESSION: Negative for nasal bone fracture. Provider: Emelda Brothers  DG Orthopantogram  Result Date: 01/01/2005 Clinical Data: [place]. Abrasion to nose. Headache. Dizziness. Bilateral jaw pain.  ORTHOPANTOGRAM (PANOREX): Findings: Multiple dental amalgams are present. Upper right first molar is missing. There is no fracture or jaw dislocation.  IMPRESSION: No acute mandibular or maxillary findings.  NASAL BONES - 3 VIEW: Findings: The nasal bones on the right and left show no fracture. There may be very mild soft tissue swelling. No apparent sinus opacity is seen.  IMPRESSION: Negative for nasal bone fracture. Provider: Emelda Brothers   No results found.  No results found.    Assessment and Plan: Patient Active Problem List   Diagnosis Date Noted   GERD (gastroesophageal reflux disease) 10/16/2011   OSA on CPAP 10/16/2011   Migraines 10/02/2011      The patient does tolerate PAP and reports benefit from PAP use. The patient was reminded how to adjust mask fit and advised to change supplies regularly. The patient was also counselled on nightly use. The compliance is excellent. The AHI is 2.7. Patient continues to require PAP to treat their apnea and is medically necessary. The patient's machine is past end of life and must be replaced.   1. OSA on CPAP continue excellent compliance. Follow up 30+ days after set up  2. CPAP use counseling CPAP couseling-Discussed importance of adequate CPAP use as well as proper care and cleaning techniques of machine and all supplies.  3. Gastroesophageal reflux disease without esophagitis Continue current medication and f/u with PCP.  4. Morbid obesity with BMI of 40.0-44.9, adult ([facility]) Obesity Counseling: Had a  lengthy discussion regarding patients BMI and weight issues. Patient was instructed on portion control as well as increased activity. Also discussed caloric restrictions with trying to maintain intake less than 2000 Kcal. Discussions were made in accordance with the 5As of weight management. Simple actions such as not eating late and if able to, taking a walk is suggested.    General Counseling: I have discussed the findings of the evaluation and examination with  Belinda Mccarthy.  I have also discussed any further diagnostic evaluation thatmay be needed or ordered today. Belinda Mccarthy verbalizes understanding of the findings of todays visit. We also reviewed her medications today and discussed drug interactions and side effects including but not limited excessive drowsiness and altered mental states. We also discussed that there is always a risk not just to her but also people around her. she has been encouraged to call the office with any questions or concerns that should arise related to todays visit.  No orders of the defined types were placed in this encounter.       I have personally obtained a history, examined the patient, evaluated laboratory and imaging results, formulated the assessment and plan and placed orders.  This patient was seen by Lynn Ito, PA-C in collaboration with Dr. Freda Munro as a part of collaborative care agreement.  Belinda Pax, MD [facility] Diplomate ABMS Pulmonary Critical Care Medicine and Sleep Medicine

## 2023-09-11 NOTE — Progress Notes (Unsigned)
 [facility] [address] [place], [place] [address]  Pulmonary Sleep Medicine   Office Visit Note  Patient Name: Belinda Mccarthy DOB: October 25, 1950 MRN [id number]    Chief Complaint: Obstructive Sleep Apnea visit  Brief History:  Belinda Mccarthy is seen today for a follow up visit for APAP@ 8-14 cmH2O. The patient has a longstanding history of sleep apnea. Patient is using PAP nightly.  The patient feels rested after sleeping with PAP.  The patient reports benefiting from PAP use. Reported sleepiness is  improved and the Epworth Sleepiness Score is 9 out of 24. The patient will occasionally take naps. The patient complains of the following: none.  The compliance download shows 99% compliance with an average use time of 8 hours 49 minutes. The AHI is 2.7.  The patient does not complain of limb movements disrupting sleep. The patient continues to require PAP therapy in order to eliminate sleep apnea.   ROS  General: (-) fever, (-) chills, (-) night sweat Nose and Sinuses: (-) nasal stuffiness or itchiness, (-) postnasal drip, (-) nosebleeds, (-) sinus trouble. Mouth and Throat: (-) sore throat, (-) hoarseness. Neck: (-) swollen glands, (-) enlarged thyroid, (-) neck pain. Respiratory: - cough, - shortness of breath, - wheezing. Neurologic: + numbness, + tingling. Psychiatric: - anxiety, - depression   Current Medication: Outpatient Encounter Medications as of 09/15/2023  Medication Sig   pantoprazole (PROTONIX) 40 MG tablet pantoprazole 40 mg tablet,delayed release   polyethylene glycol powder (GLYCOLAX/MIRALAX) 17 GM/SCOOP powder Take by mouth.   rizatriptan (MAXALT) 10 MG tablet rizatriptan 10 mg tablet   No facility-administered encounter medications on file as of 09/15/2023.    Surgical History: Past Surgical History:  Procedure Laterality Date   ABDOMINAL HYSTERECTOMY     APPENDECTOMY     BREAST SURGERY     CERVICAL FUSION     CYST REMOVAL HAND     HERNIA REPAIR      TONSILLECTOMY AND ADENOIDECTOMY      Medical History: History reviewed. No pertinent past medical history.  Family History: Non contributory to the present illness  Social History: Social History   Socioeconomic History   Marital status: Married    Spouse name: Not on file   Number of children: Not on file   Years of education: Not on file   Highest education level: Not on file  Occupational History   Not on file  Tobacco Use   Smoking status: Never   Smokeless tobacco: Never  Substance and Sexual Activity   Alcohol use: Never   Drug use: Never   Sexual activity: Not on file  Other Topics Concern   Not on file  Social History Narrative   Not on file   Social Drivers of Health   Financial Resource Strain: Patient Declined (08/12/2023)   Received from [facility]   Overall Financial Resource Strain (CARDIA)    Difficulty of Paying Living Expenses: Patient declined  Food Insecurity: Patient Declined (08/12/2023)   Received from [facility]   Hunger Vital Sign    Worried About Running Out of Food in the Last Year: Patient declined    Ran Out of Food in the Last Year: Patient declined  Transportation Needs: Patient Declined (08/12/2023)   Received from [facility] - Transportation    In the past 12 months, has lack of transportation kept you from medical appointments or from getting medications?: Patient declined    Lack of Transportation (  Non-Medical): Patient declined  Physical Activity: Not on file  Stress: Not on file  Social Connections: Not on file  Intimate Partner Violence: Not on file    Vital Signs: Blood pressure 132/85, pulse 75, height 5\' 3"  (1.6 m), weight 245 lb (111.1 kg). Body mass index is 43.4 kg/m.    Examination: General Appearance: The patient is well-developed, well-nourished, and in no distress. Neck Circumference: 43 cm Skin: Gross inspection of skin unremarkable. Head:  normocephalic, no gross deformities. Eyes: no gross deformities noted. ENT: ears appear grossly normal Neurologic: Alert and oriented. No involuntary movements.  STOP BANG RISK ASSESSMENT S (snore) Have you been told that you snore?     NO   T (tired) Are you often tired, fatigued, or sleepy during the day?   NO  O (obstruction) Do you stop breathing, choke, or gasp during sleep? NO   P (pressure) Do you have or are you being treated for high blood pressure? NO   B (BMI) Is your body index greater than 35 kg/m? YES   A (age) Are you 73 years old or older? YES   N (neck) Do you have a neck circumference greater than 16 inches?   YES   G (gender) Are you a female? NO   TOTAL STOP/BANG "YES" ANSWERS 3       A STOP-Bang score of 2 or less is considered low risk, and a score of 5 or more is high risk for having either moderate or severe OSA. For people who score 3 or 4, doctors may need to perform further assessment to determine how likely they are to have OSA.         EPWORTH SLEEPINESS SCALE:  Scale:  (0)= no chance of dozing; (1)= slight chance of dozing; (2)= moderate chance of dozing; (3)= high chance of dozing  Chance  Situtation    Sitting and reading: 1    Watching TV: 1    Sitting Inactive in public: 1    As a passenger in car: 2      Lying down to rest: 3    Sitting and talking: 0    Sitting quielty after lunch: 1    In a car, stopped in traffic: 0   TOTAL SCORE:   9 out of 24    SLEEP STUDIES:  PSG (03/2016) AHI 7/hr, min SpO2 85% Titration (04/2016) APAP@ 8-14 cmH2O   CPAP COMPLIANCE DATA:  Date Range: 09/16/2022-09/15/2023  Average Daily Use: 8 hours 49 minutes  Median Use: 8 hours 44 minutes  Compliance for > 4 Hours: 99%  AHI: 2.7 respiratory events per hour  Days Used: 363/365 days  Mask Leak: 27.8  95th Percentile Pressure: 12.5         LABS: No results found for this or any previous visit (from the past 2160  hours).  Radiology: CT Head Wo Contrast Result Date: 01/02/2005 Clinical data: Fall. Head trauma. Headache. HEAD CT WITHOUT CONTRAST: Technique: Contiguous axial images were obtained from the base of the skull through the vertex according to standard protocol without contrast. There is no evidence of intracranial hemorrhage, brain edema, or mass effect. No other intra-axial abnormalities are seen, and the ventricles are within normal limits. No abnormal extra-axial fluid collections or masses are identified. No skull abnormalities are noted. IMPRESSION: Negative non-contrast head CT. Provider: Dorris Gaul, Cleophus Dade  DG Shoulder Left Result Date: 01/01/2005 Clinical Data:  Marvell Slider.  Diffuse pain.  Left rib pain. LEFT SHOUDER - 3  VIEW: There is no evidence of fracture or dislocation.  There is no evidence of arthropathy or other focal bone abnormality.  Soft tissues are unremarkable. IMPRESSION: Negative. LEFT RIBS - two VIEW: No fracture or other bone lesions are seen involving the ribs. IMPRESSION: Negative. Provider: Olevia Bers  DG Ribs Unilateral Left Result Date: 01/01/2005 Clinical Data:  Marvell Slider.  Diffuse pain.  Left rib pain. LEFT SHOUDER - 3 VIEW: There is no evidence of fracture or dislocation.  There is no evidence of arthropathy or other focal bone abnormality.  Soft tissues are unremarkable. IMPRESSION: Negative. LEFT RIBS - two VIEW: No fracture or other bone lesions are seen involving the ribs. IMPRESSION: Negative. Provider: Olevia Bers  DG Nasal Bones Result Date: 01/01/2005 Clinical Data: Marvell Slider. Abrasion to nose. Headache. Dizziness. Bilateral jaw pain.  ORTHOPANTOGRAM (PANOREX): Findings: Multiple dental amalgams are present. Upper right first molar is missing. There is no fracture or jaw dislocation.  IMPRESSION: No acute mandibular or maxillary findings.  NASAL BONES - 3 VIEW: Findings: The nasal bones on the right and left show no fracture. There may be very mild soft tissue  swelling. No apparent sinus opacity is seen.  IMPRESSION: Negative for nasal bone fracture. Provider: Olevia Bers  DG Orthopantogram Result Date: 01/01/2005 Clinical Data: Marvell Slider. Abrasion to nose. Headache. Dizziness. Bilateral jaw pain.  ORTHOPANTOGRAM (PANOREX): Findings: Multiple dental amalgams are present. Upper right first molar is missing. There is no fracture or jaw dislocation.  IMPRESSION: No acute mandibular or maxillary findings.  NASAL BONES - 3 VIEW: Findings: The nasal bones on the right and left show no fracture. There may be very mild soft tissue swelling. No apparent sinus opacity is seen.  IMPRESSION: Negative for nasal bone fracture. Provider: Olevia Bers   No results found.  No results found.    Assessment and Plan: Patient Active Problem List   Diagnosis Date Noted   GERD (gastroesophageal reflux disease) 10/16/2011   OSA on CPAP 10/16/2011   Migraines 10/02/2011      The patient does tolerate PAP and reports benefit from PAP use. The patient was reminded how to adjust mask fit and advised to change supplies regularly. The patient was also counselled on nightly use. The compliance is excellent. The AHI is 2.7. Patient continues to require PAP to treat their apnea and is medically necessary.  1. OSA on CPAP (Primary) Continue excellent compliance  2. CPAP use counseling CPAP couseling-Discussed importance of adequate CPAP use as well as proper care and cleaning techniques of machine and all supplies.  3. Gastroesophageal reflux disease without esophagitis Continue current medication and f/u with PCP.  4. Morbid obesity with BMI of 40.0-44.9, adult (HCC) Obesity Counseling: Had a lengthy discussion regarding patients BMI and weight issues. Patient was instructed on portion control as well as increased activity. Also discussed caloric restrictions with trying to maintain intake less than 2000 Kcal. Discussions were made in accordance with the 5As of weight  management. Simple actions such as not eating late and if able to, taking a walk is suggested.    General Counseling: I have discussed the findings of the evaluation and examination with Jennet.  I have also discussed any further diagnostic evaluation thatmay be needed or ordered today. Opel verbalizes understanding of the findings of todays visit. We also reviewed her medications today and discussed drug interactions and side effects including but not limited excessive drowsiness and altered mental states. We also discussed that there is always a risk not just to  her but also people around her. she has been encouraged to call the office with any questions or concerns that should arise related to todays visit.  No orders of the defined types were placed in this encounter.       I have personally obtained a history, examined the patient, evaluated laboratory and imaging results, formulated the assessment and plan and placed orders.  This patient was seen by Taylor Favia, PA-C in collaboration with Dr. Cam Cava as a part of collaborative care agreement.  Cordie Deters, MD [facility] Diplomate ABMS Pulmonary Critical Care Medicine and Sleep Medicine

## 2023-09-15 ENCOUNTER — Ambulatory Visit (INDEPENDENT_AMBULATORY_CARE_PROVIDER_SITE_OTHER): Admitting: Internal Medicine

## 2023-09-15 VITALS — BP 132/85 | HR 75 | Ht 63.0 in | Wt 245.0 lb

## 2023-09-15 DIAGNOSIS — Z6841 Body Mass Index (BMI) 40.0 and over, adult: Secondary | ICD-10-CM

## 2023-09-15 DIAGNOSIS — G4733 Obstructive sleep apnea (adult) (pediatric): Secondary | ICD-10-CM | POA: Diagnosis not present

## 2023-09-15 DIAGNOSIS — K219 Gastro-esophageal reflux disease without esophagitis: Secondary | ICD-10-CM

## 2023-09-15 DIAGNOSIS — Z7189 Other specified counseling: Secondary | ICD-10-CM | POA: Diagnosis not present

## 2023-09-15 NOTE — Patient Instructions (Signed)
